# Patient Record
Sex: Male | Born: 1960 | Marital: Married
Health system: Midwestern US, Community
[De-identification: ages and names within clinical notes are randomized; demographics above are authoritative.]

## PROBLEM LIST (undated history)

## (undated) DIAGNOSIS — I10 Essential (primary) hypertension: Secondary | ICD-10-CM

## (undated) DIAGNOSIS — I519 Heart disease, unspecified: Secondary | ICD-10-CM

## (undated) HISTORY — DX: Heart disease, unspecified: I51.9

---

## 2008-07-16 HISTORY — PX: CARDIAC CATHETERIZATION: SHX172

## 2008-07-16 HISTORY — PX: OTHER SURGICAL HISTORY: SHX169

## 2014-12-07 LAB — BASIC METABOLIC PANEL: Glucose: 102

## 2015-02-07 ENCOUNTER — Encounter (HOSPITAL_COMMUNITY): Payer: Self-pay | Admitting: Emergency Medicine

## 2015-02-07 ENCOUNTER — Emergency Department (HOSPITAL_COMMUNITY)
Admission: EM | Admit: 2015-02-07 | Discharge: 2015-02-07 | Disposition: A | Discharge status: Home / Self Care | Payer: Self-pay | Attending: Emergency Medicine | Admitting: Emergency Medicine

## 2015-02-07 ENCOUNTER — Emergency Department (HOSPITAL_COMMUNITY): Payer: Self-pay

## 2015-02-07 DIAGNOSIS — I10 Essential (primary) hypertension: Secondary | ICD-10-CM | POA: Insufficient documentation

## 2015-02-07 DIAGNOSIS — Y9389 Activity, other specified: Secondary | ICD-10-CM | POA: Insufficient documentation

## 2015-02-07 DIAGNOSIS — S8991XA Unspecified injury of right lower leg, initial encounter: Secondary | ICD-10-CM | POA: Insufficient documentation

## 2015-02-07 DIAGNOSIS — X58XXXA Exposure to other specified factors, initial encounter: Secondary | ICD-10-CM | POA: Insufficient documentation

## 2015-02-07 DIAGNOSIS — Y998 Other external cause status: Secondary | ICD-10-CM | POA: Insufficient documentation

## 2015-02-07 DIAGNOSIS — M25561 Pain in right knee: Secondary | ICD-10-CM

## 2015-02-07 DIAGNOSIS — Z9889 Other specified postprocedural states: Secondary | ICD-10-CM | POA: Insufficient documentation

## 2015-02-07 DIAGNOSIS — Z79899 Other long term (current) drug therapy: Secondary | ICD-10-CM | POA: Insufficient documentation

## 2015-02-07 DIAGNOSIS — Y9289 Other specified places as the place of occurrence of the external cause: Secondary | ICD-10-CM | POA: Insufficient documentation

## 2015-02-07 DIAGNOSIS — Z87891 Personal history of nicotine dependence: Secondary | ICD-10-CM | POA: Insufficient documentation

## 2015-02-07 DIAGNOSIS — Z7982 Long term (current) use of aspirin: Secondary | ICD-10-CM | POA: Insufficient documentation

## 2015-02-07 HISTORY — DX: Essential (primary) hypertension: I10

## 2015-02-07 MED ORDER — NAPROXEN 250 MG PO TABS: 250.0000 mg | ORAL_TABLET | Freq: Two times a day (BID) | ORAL | Status: AC

## 2015-02-07 NOTE — ED Notes (Signed)
54 yo with c/o right knee pain and swelling after standing up from a bended position. States he heard a loud pop. Moderate swelling noted with palpable pulse.

## 2015-02-07 NOTE — ED Provider Notes (Signed)
CSN: [id number]     Arrival date & time 02/07/15  1258 History  This chart was scribed for non-physician practitioner, Everlene Farrier, PA-C, working with Linwood Dibbles, MD by Charline Bills, ED Scribe. This patient was seen in room WTR8/WTR8 and the patient's care was started at 2:29 PM.   Chief Complaint  Patient presents with  . Joint Swelling   The history is provided by the patient. No language interpreter was used.   HPI Comments: Manuel Hardy is a 54 y.o. male, with a h/o HTN, who presents to the Emergency Department complaining of gradually worsening, constant right knee pain since yesterday. Pt reports pain after standing from a bent position yesterday. He states that he heard a  loud "pop" in his knee and associated swelling onset yesterday. He is unable to pin point his pain and complains of pain to the medial and lateral aspect as well as anterior.  No previous injury. No treatments tried PTA. No falls. No numbness, tingling or weakness. He denies fevers or chills.  Past Medical History  Diagnosis Date  . Hypertension    Past Surgical History  Procedure Laterality Date  . Cardiac catheterization  2010  . Stents  2010   No family history on file. History  Substance Use Topics  . Smoking status: Former Games developer  . Smokeless tobacco: Not on file  . Alcohol Use: No    Review of Systems  Constitutional: Negative for fever and chills.  Cardiovascular: Negative for leg swelling.  Musculoskeletal: Positive for joint swelling and arthralgias. Negative for gait problem.  Skin: Negative for rash and wound.  Neurological: Negative for weakness and numbness.   Allergies  Review of patient's allergies indicates no known allergies.  Home Medications   Prior to Admission medications   Medication Sig Start Date End Date Taking? Authorizing Provider  aspirin 81 MG tablet Take 81 mg by mouth daily.   Yes Historical Provider, MD  atenolol (TENORMIN) 100 MG tablet Take 100 mg by mouth  daily.   Yes Historical Provider, MD  atorvastatin (LIPITOR) 20 MG tablet Take 20 mg by mouth daily.   Yes Historical Provider, MD  lisinopril (PRINIVIL,ZESTRIL) 20 MG tablet Take 20 mg by mouth daily.   Yes Historical Provider, MD  naproxen (NAPROSYN) 250 MG tablet Take 1 tablet (250 mg total) by mouth 2 (two) times daily with a meal. 02/07/15   Everlene Farrier, PA-C   BP 136/90 mmHg  Pulse 60  Temp(Src) 98.2 F (36.8 C) (Oral)  Resp 18  Ht  (1.549 m)  Wt 250 lb (113.399 kg)  BMI 47.26 kg/m2  SpO2 99% Physical Exam  Constitutional: He appears well-developed and well-nourished. No distress.  Nontoxic appearing.  HENT:  Head: Normocephalic and atraumatic.  Eyes: Right eye exhibits no discharge. Left eye exhibits no discharge.  Cardiovascular: Normal rate and intact distal pulses.   Pulses:      Dorsalis pedis pulses are 2+ on the right side, and 2+ on the left side.       Posterior tibial pulses are 2+ on the right side, and 2+ on the left side.  Pulmonary/Chest: Effort normal. No respiratory distress.  Musculoskeletal:  R knee: Mild edema to anterior aspect of R knee. No knee instability. All lower leg comparments are soft. No evidence of a Baker's cyst. No erythema or warmth. He has good range of motion of his right knee. He is able to ambulate without difficulty or assistance. No calf tenderness or edema.  Neurological: He is alert. Coordination and gait normal.  Able to ambulate without difficulty or assistance. Sensation is intact to his bilateral lower extremities.   Skin: Skin is warm and dry. No rash noted. He is not diaphoretic. No erythema. No pallor.  Psychiatric: He has a normal mood and affect. His behavior is normal.  Nursing note and vitals reviewed.  ED Course  Procedures (including critical care time) DIAGNOSTIC STUDIES: Oxygen Saturation is 99% on RA, normal by my interpretation.    COORDINATION OF CARE: 2:32 PM-Discussed treatment plan which includes XR  with pt at bedside and pt agreed to plan.   Labs Review Labs Reviewed - No data to display  Imaging Review Dg Knee Complete 4 Views Right  02/07/2015   CLINICAL DATA:  Injury yesterday.  Now with knee pain today.  EXAM: RIGHT KNEE - COMPLETE 4+ VIEW  COMPARISON:  None.  FINDINGS: Small joint effusion. Mild tricompartment osteoarthritic change. No fracture. No dislocation.  IMPRESSION: No acute bony pathology.  Joint effusion.   Electronically Signed   By: Jolaine Click M.D.   On: 02/07/2015 15:07    EKG Interpretation None      Filed Vitals:   02/07/15 1359  BP: 136/90  Pulse: 60  Temp: 98.2 F (36.8 C)  TempSrc: Oral  Resp: 18  Height: 5\' 1"  (1.549 m)  Weight: 250 lb (113.399 kg)  SpO2: 99%     MDM   Meds given in ED:  Medications - No data to display  New Prescriptions   NAPROXEN (NAPROSYN) 250 MG TABLET    Take 1 tablet (250 mg total) by mouth 2 (two) times daily with a meal.    Final diagnoses:  Right knee pain   This is a 54 year old male who presents to the emergency department complaining of right knee pain and swelling after he stood up from a bent position and heard a pop yesterday. Is complaining of generalized pain to his knee. He is unable to localize the pain. He denies any fevers, numbness, tingling or weakness. Patient is able to ambulate without difficulty or assistance. On exam he is afebrile and nontoxic appearing. There is no joint warmth. No evidence of septic joint. He has good range of motion of his right knee. I appreciate no knee instability. No knee deformity. He is neurovascularly intact.  X-rays are remarkable only for joint effusion. We'll place the patient in a knee sleeve and have him follow-up with his primary care provider and orthopedic surgery. Patient also given prescription for naproxen. I advised the patient to follow-up with their primary care provider this week. I advised the patient to return to the emergency department with new or  worsening symptoms or new concerns. The patient verbalized understanding and agreement with plan.    I personally performed the services described in this documentation, which was scribed in my presence. The recorded information has been reviewed and is accurate.       Everlene Farrier, PA-C 02/07/15 1536  Linwood Dibbles, MD 02/07/15 [phone]

## 2015-02-07 NOTE — Discharge Instructions (Signed)
Knee Pain °The knee is the complex joint between your thigh and your lower leg. It is made up of bones, tendons, ligaments, and cartilage. The bones that make up the knee are: °· The femur in the thigh. °· The tibia and fibula in the lower leg. °· The patella or kneecap riding in the groove on the lower femur. °CAUSES  °Knee pain is a common complaint with many causes. A few of these causes are: °· Injury, such as: °¨ A ruptured ligament or tendon injury. °¨ Torn cartilage. °· Medical conditions, such as: °¨ Gout °¨ Arthritis °¨ Infections °· Overuse, over training, or overdoing a physical activity. °Knee pain can be minor or severe. Knee pain can accompany debilitating injury. Minor knee problems often respond well to self-care measures or get well on their own. More serious injuries may need medical intervention or even surgery. °SYMPTOMS °The knee is complex. Symptoms of knee problems can vary widely. Some of the problems are: °· Pain with movement and weight bearing. °· Swelling and tenderness. °· Buckling of the knee. °· Inability to straighten or extend your knee. °· Your knee locks and you cannot straighten it. °· Warmth and redness with pain and fever. °· Deformity or dislocation of the kneecap. °DIAGNOSIS  °Determining what is wrong may be very straight forward such as when there is an injury. It can also be challenging because of the complexity of the knee. Tests to make a diagnosis may include: °· Your caregiver taking a history and doing a physical exam. °· Routine X-rays can be used to rule out other problems. X-rays will not reveal a cartilage tear. Some injuries of the knee can be diagnosed by: °· Arthroscopy a surgical technique by which a small video camera is inserted through tiny incisions on the sides of the knee. This procedure is used to examine and repair internal knee joint problems. Tiny instruments can be used during arthroscopy to repair the torn knee cartilage (meniscus). °· Arthrography  is a radiology technique. A contrast liquid is directly injected into the knee joint. Internal structures of the knee joint then become visible on X-ray film. °· An MRI scan is a non X-ray radiology procedure in which magnetic fields and a computer produce two- or three-dimensional images of the inside of the knee. Cartilage tears are often visible using an MRI scanner. MRI scans have largely replaced arthrography in diagnosing cartilage tears of the knee. °· Blood work. °· Examination of the fluid that helps to lubricate the knee joint (synovial fluid). This is done by taking a sample out using a needle and a syringe. °TREATMENT °The treatment of knee problems depends on the cause. Some of these treatments are: °· Depending on the injury, proper casting, splinting, surgery, or physical therapy care will be needed. °· Give yourself adequate recovery time. Do not overuse your joints. If you begin to get sore during workout routines, back off. Slow down or do fewer repetitions. °· For repetitive activities such as cycling or running, maintain your strength and nutrition. °· Alternate muscle groups. For example, if you are a weight lifter, work the upper body on one day and the lower body the next. °· Either tight or weak muscles do not give the proper support for your knee. Tight or weak muscles do not absorb the stress placed on the knee joint. Keep the muscles surrounding the knee strong. °· Take care of mechanical problems. °· If you have flat feet, orthotics or special shoes may help.   See your caregiver if you need help. °· Arch supports, sometimes with wedges on the inner or outer aspect of the heel, can help. These can shift pressure away from the side of the knee most bothered by osteoarthritis. °· A brace called an "unloader" brace also may be used to help ease the pressure on the most arthritic side of the knee. °· If your caregiver has prescribed crutches, braces, wraps or ice, use as directed. The acronym  for this is PRICE. This means protection, rest, ice, compression, and elevation. °· Nonsteroidal anti-inflammatory drugs (NSAIDs), can help relieve pain. But if taken immediately after an injury, they may actually increase swelling. Take NSAIDs with food in your stomach. Stop them if you develop stomach problems. Do not take these if you have a history of ulcers, stomach pain, or bleeding from the bowel. Do not take without your caregiver's approval if you have problems with fluid retention, heart failure, or kidney problems. °· For ongoing knee problems, physical therapy may be helpful. °· Glucosamine and chondroitin are over-the-counter dietary supplements. Both may help relieve the pain of osteoarthritis in the knee. These medicines are different from the usual anti-inflammatory drugs. Glucosamine may decrease the rate of cartilage destruction. °· Injections of a corticosteroid drug into your knee joint may help reduce the symptoms of an arthritis flare-up. They may provide pain relief that lasts a few months. You may have to wait a few months between injections. The injections do have a small increased risk of infection, water retention, and elevated blood sugar levels. °· Hyaluronic acid injected into damaged joints may ease pain and provide lubrication. These injections may work by reducing inflammation. A series of shots may give relief for as long as 6 months. °· Topical painkillers. Applying certain ointments to your skin may help relieve the pain and stiffness of osteoarthritis. Ask your pharmacist for suggestions. Many over the-counter products are approved for temporary relief of arthritis pain. °· In some countries, doctors often prescribe topical NSAIDs for relief of chronic conditions such as arthritis and tendinitis. A review of treatment with NSAID creams found that they worked as well as oral medications but without the serious side effects. °PREVENTION °· Maintain a healthy weight. Extra pounds  put more strain on your joints. °· Get strong, stay limber. Weak muscles are a common cause of knee injuries. Stretching is important. Include flexibility exercises in your workouts. °· Be smart about exercise. If you have osteoarthritis, chronic knee pain or recurring injuries, you may need to change the way you exercise. This does not mean you have to stop being active. If your knees ache after jogging or playing basketball, consider switching to swimming, water aerobics, or other low-impact activities, at least for a few days a week. Sometimes limiting high-impact activities will provide relief. °· Make sure your shoes fit well. Choose footwear that is right for your sport. °· Protect your knees. Use the proper gear for knee-sensitive activities. Use kneepads when playing volleyball or laying carpet. Buckle your seat belt every time you drive. Most shattered kneecaps occur in car accidents. °· Rest when you are tired. °SEEK MEDICAL CARE IF:  °You have knee pain that is continual and does not seem to be getting better.  °SEEK IMMEDIATE MEDICAL CARE IF:  °Your knee joint feels hot to the touch and you have a high fever. °MAKE SURE YOU:  °· Understand these instructions. °· Will watch your condition. °· Will get help right away if you are not   doing well or get worse. °Document Released: 04/29/2007 Document Revised: 09/24/2011 Document Reviewed: 04/29/2007 °[employer]® Patient Information ©2015 [employer]. This information is not intended to replace advice given to you by your health care provider. Make sure you discuss any questions you have with your health care provider. ° °Knee Bracing °Knee braces are supports to help stabilize and protect an injured or painful knee. They come in many different styles. They should support and protect the knee without increasing the chance of other injuries to yourself or others. It is important not to have a false sense of security when using a brace. Knee braces that help you  to keep using your knee: °· Do not restore normal knee stability under high stress forces. °· May decrease some aspects of athletic performance. °Some of the different types of knee braces are: °· Prophylactic knee braces are designed to prevent or reduce the severity of knee injuries during sports that make injury to the knee more likely. °· Rehabilitative knee braces are designed to allow protected motion of: °¨ Injured knees. °¨ Knees that have been treated with or without surgery. °There is no evidence that the use of a supportive knee brace protects the graft following a successful anterior cruciate ligament (ACL) reconstruction. However, braces are sometimes used to:  °· Protect injured ligaments. °· Control knee movement during the initial healing period. °They may be used as part of the treatment program for the various injured ligaments or cartilage of the knee including the: °· Anterior cruciate ligament. °· Medial collateral ligament. °· Medial or lateral cartilage (meniscus). °· Posterior cruciate ligament. °· Lateral collateral ligament. °Rehabilitative knee braces are most commonly used: °· During crutch-assisted walking right after injury. °· During crutch-assisted walking right after surgery to repair the cartilage and/or cruciate ligament injury. °· For a short period of time, 2-8 weeks, after the injury or surgery. °The value of a rehabilitative brace as opposed to a cast or splint includes the: °· Ability to adjust the brace for swelling. °· Ability to remove the brace for examinations, icing, or showering. °· Ability to allow for movement in a controlled range of motion. °Functional knee braces give support to knees that have already been injured. They are designed to provide stability for the injured knee and provide protection after repair. Functional knee braces may not affect performance much. Lower extremity muscle strengthening, flexibility, and improvement in technique are more important  than bracing in treating ligamentous knee injuries. Functional braces are not a substitute for rehabilitation or surgical procedures. °Unloader/off-loader braces are designed to provide pain relief in arthritic knees. Patients with wear and tear arthritis from growing old or from an old cartilage injury (osteoarthritis) of the knee, and bowlegged (varus) or knock-knee (valgus) deformities, often develop increased pain in the arthritic side due to increased loading. Unloader/off-loader braces are made to reduce uneven loading in such knees. There is reduction in bowing out movement in bowlegged knees when the correct unloader brace is used. Patients with advanced osteoarthritis or severe varus or valgus alignment problems would not likely benefit from bracing. °Patellofemoral braces help the kneecap to move smoothly and well centered over the end of the femur in the knee.  °Most people who wear knee braces feel that they help. However, there is a lack of scientific evidence that knee braces are helpful at the level needed for athletic participation to prevent injury. In spite of this, athletes report an increase in knee stability, pain relief, performance improvement, and confidence   during athletics when using a brace.  °Different knee problems require different knee braces: °· Your caregiver may suggest one kind of knee brace after knee surgery. °· A caregiver may choose another kind of knee brace for support instead of surgery for some types of torn ligaments. °· You may also need one for pain in the front of your knee that is not getting better with strengthening and flexibility exercises. °Get your caregiver's advice if you want to try a knee brace. The caregiver will advise you on where to get them and provide a prescription when it is needed to fashion and/or fit the brace. °Knee braces are the least important part of preventing knee injuries or getting better following injury. Stretching, strengthening and  technique improvement are far more important in caring for and preventing knee injuries. When strengthening your knee, increase your activities a little at a time so as not to develop injuries from overuse. Work out an exercise plan with your caregiver and/or physical therapist to get the best program for you. Do not let a knee brace become a crutch. °Always remember, there are no braces which support the knee as well as your original ligaments and cartilage you were born with. Conditioning, proper warm-up, and stretching remain the most important parts of keeping your knees healthy. °HOW TO USE A KNEE BRACE °· During sports, knee braces should be used as directed by your caregiver. °· Make sure that the hinges are where the knee bends. °· Straps, tapes, or hook-and-loop tapes should be fastened around your leg as instructed. °· You should check the placement of the brace during activities to make sure that it has not moved. Poorly positioned braces can hurt rather than help you. °· To work well, a knee brace should be worn during all activities that put you at risk of knee injury. °· Warm up properly before beginning athletic activities. °HOME CARE INSTRUCTIONS °· Knee braces often get damaged during normal use. Replace worn-out braces for maximum benefit. °· Clean regularly with soap and water. °· Inspect your brace often for wear and tear. °· Cover exposed metal to protect others from injury. °· Durable materials may cost more, but last longer. °SEEK IMMEDIATE MEDICAL CARE IF:  °· Your knee seems to be getting worse rather than better. °· You have increasing pain or swelling in the knee. °· You have problems caused by the knee brace. °· You have increased swelling or inflammation (redness or soreness) in your knee. °· Your knee becomes warm and more painful and you develop an unexplained temperature over 101°F (38.3°C). °MAKE SURE YOU:  °· Understand these instructions. °· Will watch your condition. °· Will get  help right away if you are not doing well or get worse. °See your caregiver, physical therapist, or orthopedic surgeon for additional information. °Document Released: 09/22/2003 Document Revised: 11/16/2013 Document Reviewed: 12/29/2008 °Exit[employer]® Patient Information ©2015 [employer]. This information is not intended to replace advice given to you by your health care provider. Make sure you discuss any questions you have with your health care provider. ° °

## 2015-03-05 DIAGNOSIS — Z0130 Encounter for examination of blood pressure without abnormal findings: Secondary | ICD-10-CM | POA: Insufficient documentation

## 2016-05-18 IMAGING — CR DG KNEE COMPLETE 4+V*R*
4 series · 4 of 4 positions shown · non-contrast
Comparison: None.

CLINICAL DATA: Injury yesterday.  Now with knee pain today.

EXAM:
RIGHT KNEE - COMPLETE 4+ VIEW

[t knee ap right]
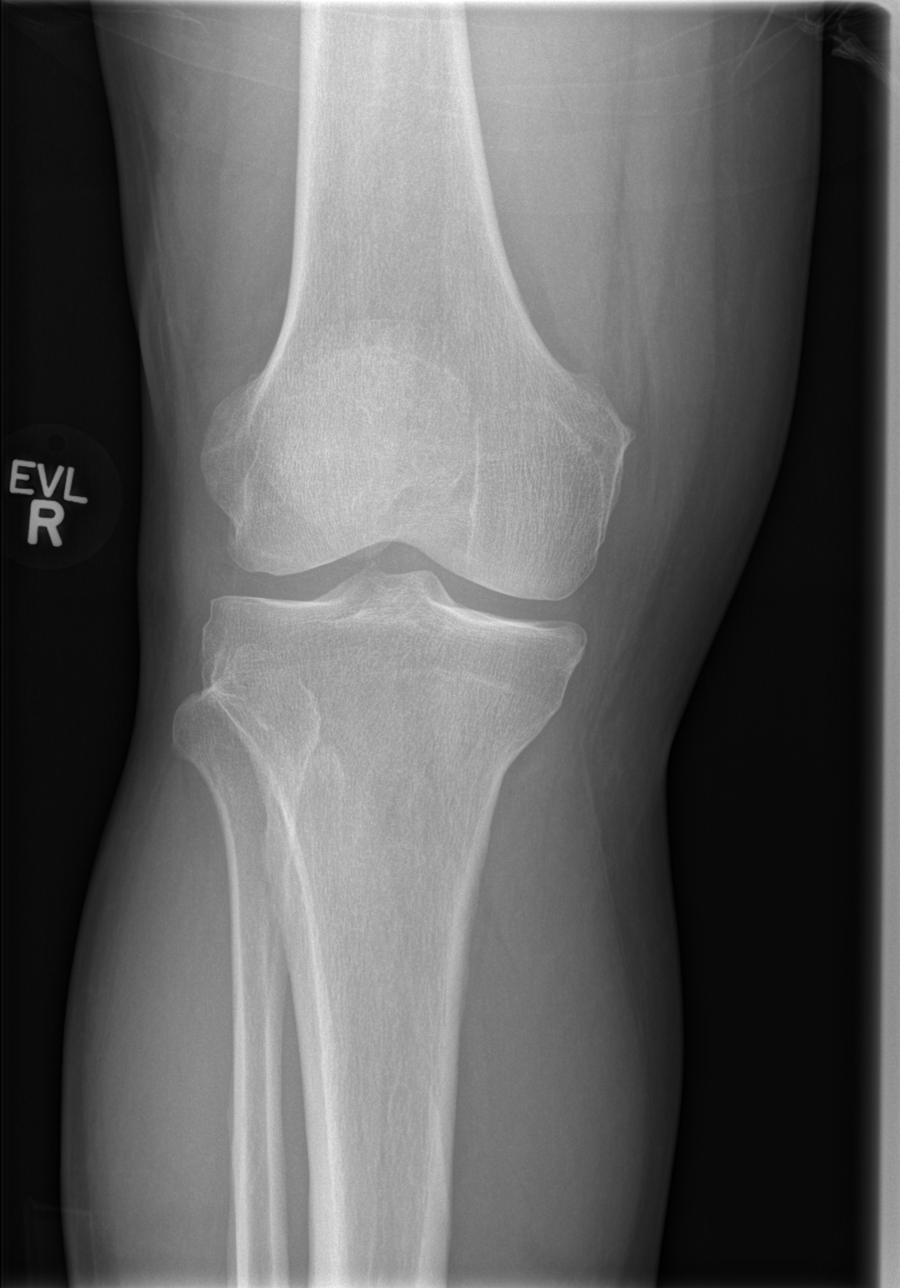

[t knee obl right (1 of 2)]
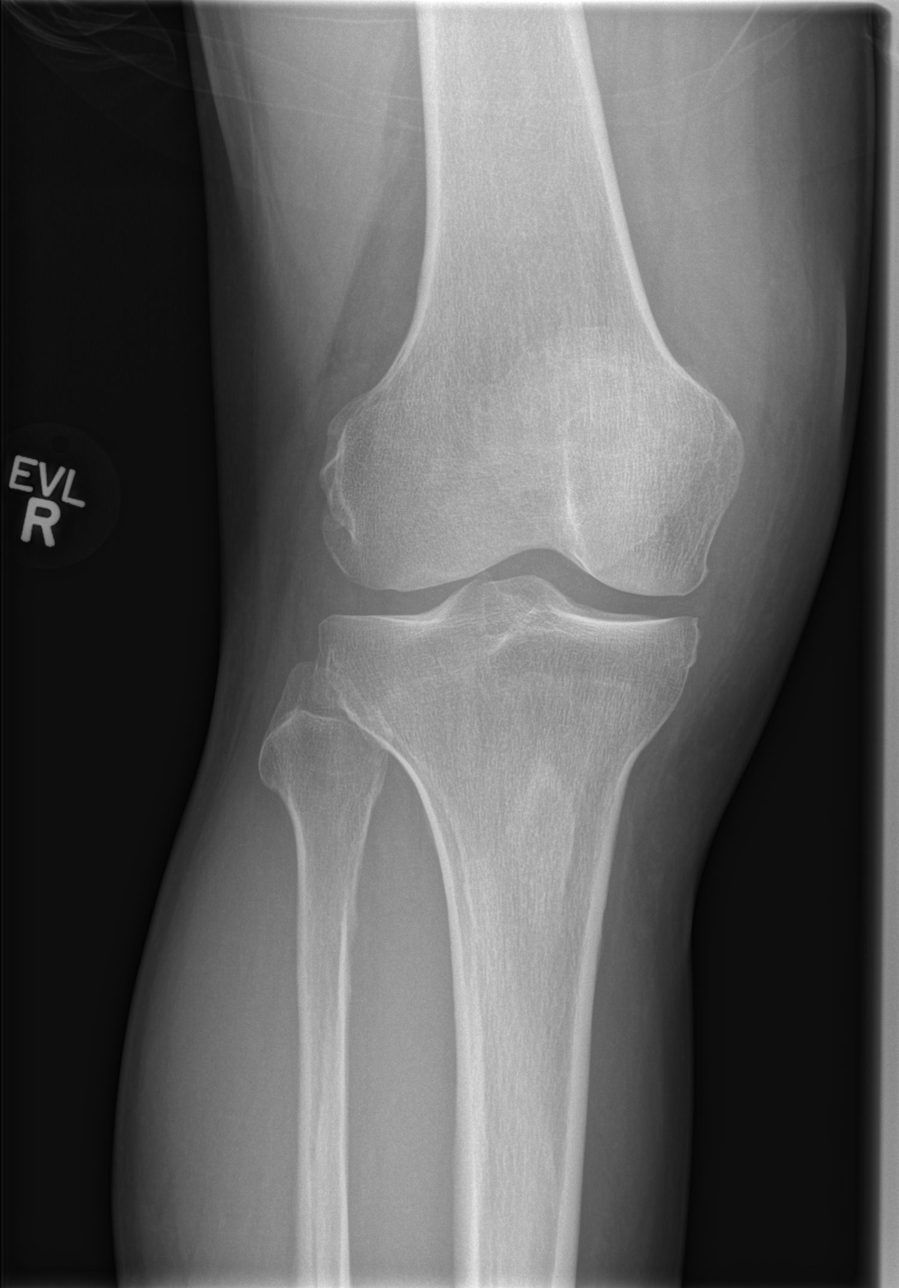

[t knee obl right (2 of 2)]
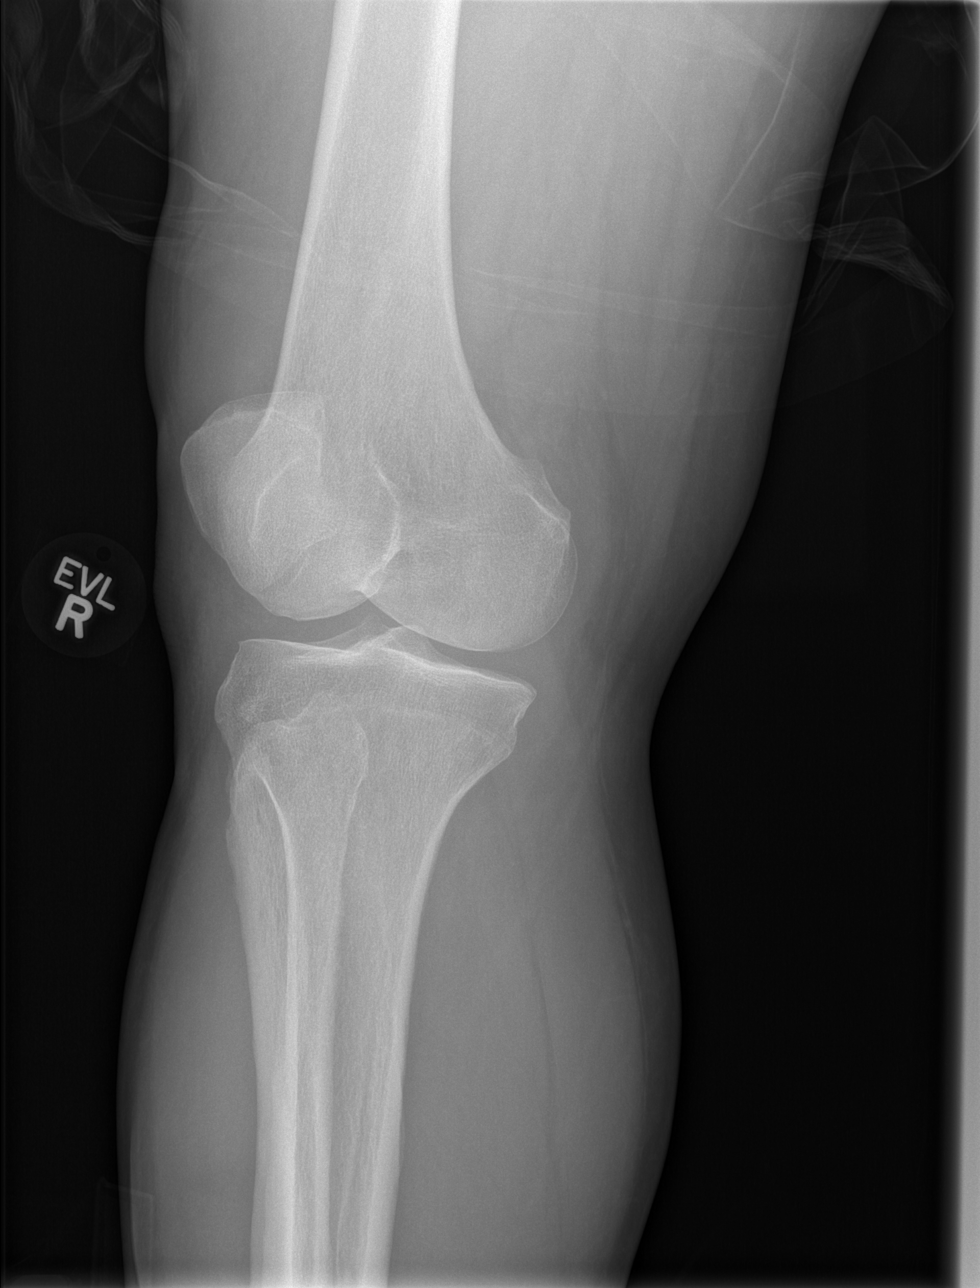

[t knee lat right]
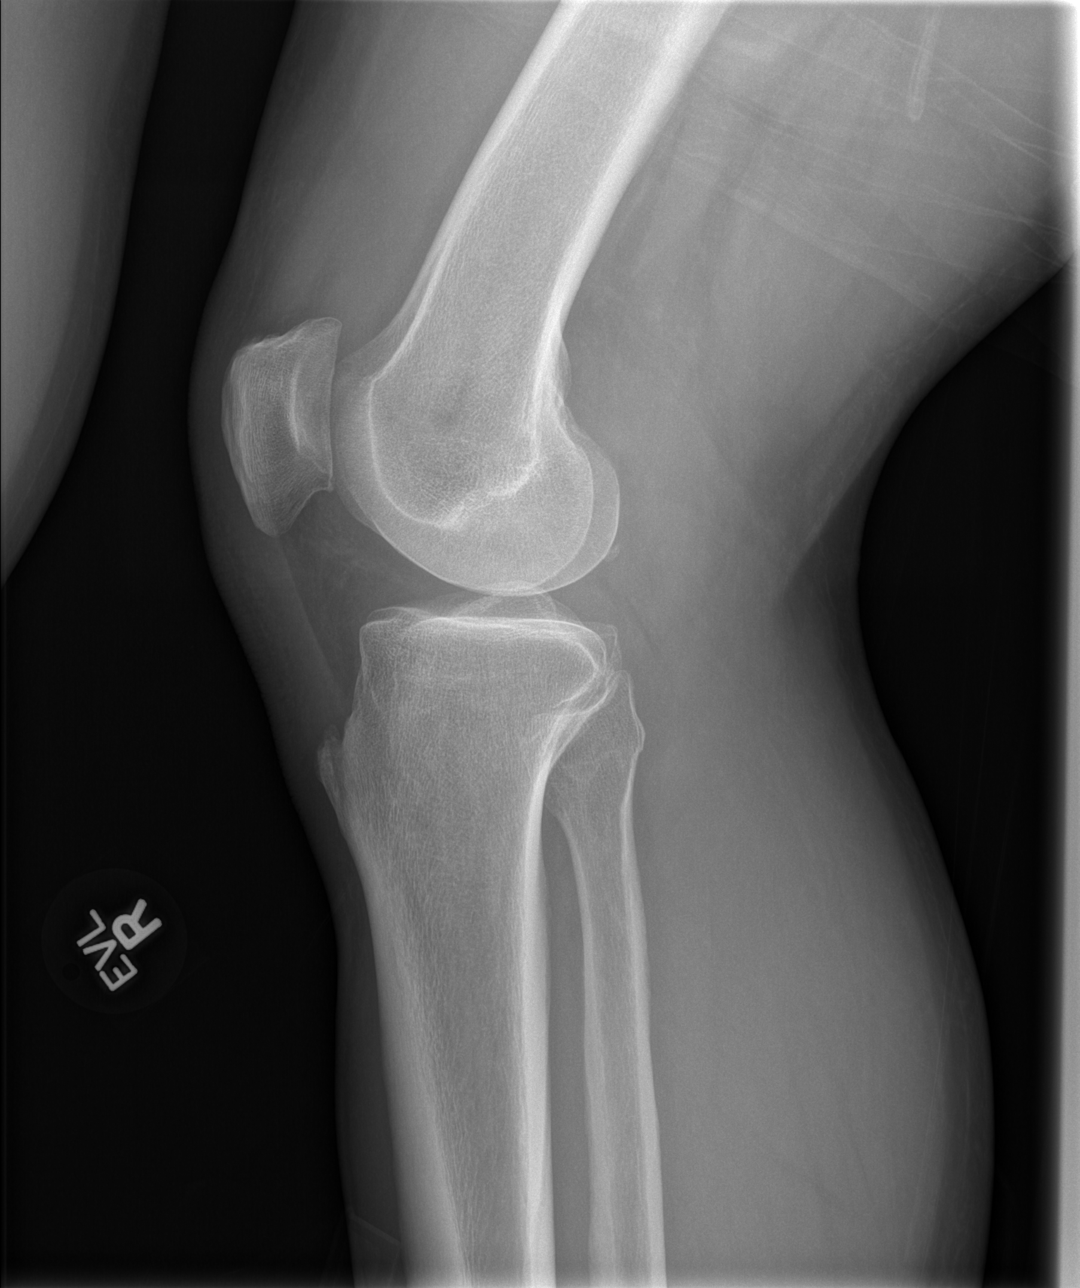

[4 of 4 positions shown; findings below may reference images not displayed]

FINDINGS: Small joint effusion. Mild tricompartment osteoarthritic change. No
fracture. No dislocation.
IMPRESSION: No acute bony pathology.  Joint effusion.

## 2016-12-26 ENCOUNTER — Ambulatory Visit
Admit: 2016-12-26 | Discharge: 2016-12-26 | Payer: MEDICAID | Attending: Foot and Ankle Surgery | Primary: Student in an Organized Health Care Education/Training Program

## 2016-12-26 DIAGNOSIS — L089 Local infection of the skin and subcutaneous tissue, unspecified: Secondary | ICD-10-CM

## 2016-12-26 NOTE — Progress Notes (Signed)
New Patient Visit    Date:  12/26/16    Name: James Landry  MRN: [id number]      CC:   Chief Complaint   Patient presents with   ??? Foot Pain     left foot infection between 4-5 toe,         HPI:  James Landry is a 56 y.o. male who presents with Left    Chief Complaint   Patient presents with   ??? Foot Pain     left foot infection between 4-5 toe,      He has had 10 days of left foot pain swelling and acute increasing of erythema between the 4th and 5th toes.  He was seen at [facility] emergency room yesterday for cellulitis that developed near this infection.  He was treated with intravenous antibiotics and given a 10 day dose of oral antibiotics.  Overnight the erythema has improved dramatically, the swelling has gone down, and the pain has improved.  He denies any fever or chills in no other ft pain. He is disabled due to a history of coronary artery disease.      PMH:   Past Medical History:   Diagnosis Date   ??? Back pain    ??? Depression    ??? Hypertension        PSH:   Past Surgical History:   Procedure Laterality Date   ??? HX CORONARY STENT PLACEMENT  2010       Social History:   Social History     Social History   ??? Marital status: UNKNOWN     Spouse name: N/A   ??? Number of children: N/A   ??? Years of education: N/A     Occupational History   ??? Not on file.     Social History Main Topics   ??? Smoking status: Former Smoker   ??? Smokeless tobacco: Not on file   ??? Alcohol use No   ??? Drug use: Not on file   ??? Sexual activity: Not on file     Other Topics Concern   ??? Not on file     Social History Narrative   ??? No narrative on file       Allergies: Allergies no known allergies    Medications:   Prior to Admission medications    Medication Sig Start Date End Date Taking? Authorizing Provider   levoFLOXacin (LEVAQUIN) 500 mg tablet Take  by mouth daily.   Yes Historical Provider   trimethoprim-sulfamethoxazole (BACTRIM DS) 160-800 mg per tablet Take 1  Tab by mouth two (2) times a day.   Yes Historical Provider   aspirin delayed-release 81 mg tablet Take  by mouth daily.   Yes Historical Provider   atorvastatin (LIPITOR) 80 mg tablet Take 80 mg by mouth daily.   Yes Historical Provider   lisinopril-hydroCHLOROthiazide (PRINZIDE, ZESTORETIC) 20-25 mg per tablet Take  by mouth daily.   Yes Historical Provider   metoprolol tartrate (LOPRESSOR) 25 mg tablet Take  by mouth two (2) times a day.   Yes Historical Provider   nitroglycerin (NITROSTAT) 0.4 mg SL tablet 0.4 mg by SubLINGual route every five (5) minutes as needed for Chest Pain. Up to 3 doses.   Yes Historical Provider       Problem List:   Patient Active Problem List   Diagnosis Code   ??? Hypertension I10   ??? Back pain M54.9   ??? Depression F32.9   ???  Severe obesity (BMI 35.0-39.9) (HCC) E66.01   ??? Left foot infection L08.9   ??? Cellulitis of foot, left L03.116         ROS  General: no fevers, chills, night sweats  Psychiatric: no suicidal ideation, anxiety  Neurologic: no headaches, loss of consciousness or change in vision  Cardiovascular: no chest pain  Respiratory: no shortness of breath  GI: no abdominal pain, no ulcers  Skin: no infections, no rashes  Hematologic: no bleeding disorders, history of blood clots  Endocrine: No diabetes mellitus  Musckuloskeletal:   Chief Complaint   Patient presents with   ??? Foot Pain     left foot infection between 4-5 toe,         PHYSICAL EXAM:  Visit Vitals   ??? Ht 5\' 6"  (1.676 m)   ??? Wt 217 lb (98.4 kg)   ??? BMI 35.02 kg/m2     @BW @        Body mass index is 35.02 kg/(m^2).    General:  The patient is a well-appearing 56 y.o. male who appears his stated age. he is alert and oriented x3, pleasant, conversive, interactive and appropriate.        Gait Abnormalities: none    Inspection:   Left foot neutral  Left Lower Extremity Exam:    Skin:  There is a small open wound in the 4th webspace between the 4th and 5th  toes there is no active drainage or purulent material no surrounding cellulitis.  Swelling: mild at the forefoot  Sensory: 2/2 to light touch in the deep peroneal, superficial peroneal, sural, saphenous, tibial    Vascular:  normal DP and PT pulses    Motor: 5/5 strength to dorsiflexion, plantarflexion, inversion, eversion and great toe flexion and extension.    Palpation:   Mildly tender over the 4th webspace    Ligament exam: Negative        IMAGING:  None      IMPRESSION:  Encounter Diagnoses     ICD-10-CM ICD-9-CM   1. Left foot infection L08.9 686.9   2. Cellulitis of foot, left L03.116 682.7        PLAN:  Today we discussed the diagnosis and treatment options. The oral antibiotics have improved the cellulitis and purulence therefore he will continue packing once daily with the help of his wife.  He will finish the 10 day course of oral antibiotics and will follow up with me in 2 weeks he knows to call if he develops worsening of the cellulitis.  Once or any fever or chills.      FOLLOW-UP: in 2 week(s)    IMAGING NEXT VISIT: no

## 2017-01-09 ENCOUNTER — Ambulatory Visit
Admit: 2017-01-09 | Discharge: 2017-01-09 | Payer: MEDICAID | Attending: Foot and Ankle Surgery | Primary: Student in an Organized Health Care Education/Training Program

## 2017-01-09 DIAGNOSIS — S91302D Unspecified open wound, left foot, subsequent encounter: Secondary | ICD-10-CM

## 2017-01-09 NOTE — Progress Notes (Signed)
New Patient Visit    Date:  01/09/17    Name: James Landry  MRN: [id number]      CC:   Chief Complaint   Patient presents with   ??? Foot Pain     left foot  infection followup         HPI:  Hamish Regie Bunner is a 56 y.o. male who presents with Left    Chief Complaint   Patient presents with   ??? Foot Pain     left foot  infection followup      He has been off his antibiotics for 4 days now and denies any erythema the infection has cleared up.  He is cleaning the wound daily with Betadine on but not placing any dressings are toe spacer.  He denies any problems and no pain.      PMH:   Past Medical History:   Diagnosis Date   ??? Back pain    ??? Depression    ??? Hypertension        PSH:   Past Surgical History:   Procedure Laterality Date   ??? HX CORONARY STENT PLACEMENT  2010       Social History:   Social History     Social History   ??? Marital status: UNKNOWN     Spouse name: N/A   ??? Number of children: N/A   ??? Years of education: N/A     Occupational History   ??? Not on file.     Social History Main Topics   ??? Smoking status: Former Smoker   ??? Smokeless tobacco: Not on file   ??? Alcohol use No   ??? Drug use: Not on file   ??? Sexual activity: Not on file     Other Topics Concern   ??? Not on file     Social History Narrative   ??? No narrative on file       Allergies: No Known Allergies    Medications:   Prior to Admission medications    Medication Sig Start Date End Date Taking? Authorizing Provider   aspirin delayed-release 81 mg tablet Take  by mouth daily.   Yes Historical Provider   atorvastatin (LIPITOR) 80 mg tablet Take 80 mg by mouth daily.   Yes Historical Provider   lisinopril-hydroCHLOROthiazide (PRINZIDE, ZESTORETIC) 20-25 mg per tablet Take  by mouth daily.   Yes Historical Provider   metoprolol tartrate (LOPRESSOR) 25 mg tablet Take  by mouth two (2) times a day.   Yes Historical Provider   nitroglycerin (NITROSTAT) 0.4 mg SL tablet 0.4 mg by SubLINGual route  every five (5) minutes as needed for Chest Pain. Up to 3 doses.   Yes Historical Provider   levoFLOXacin (LEVAQUIN) 500 mg tablet Take  by mouth daily.    Historical Provider   trimethoprim-sulfamethoxazole (BACTRIM DS) 160-800 mg per tablet Take 1 Tab by mouth two (2) times a day.    Historical Provider       Problem List:   Patient Active Problem List   Diagnosis Code   ??? Hypertension I10   ??? Back pain M54.9   ??? Depression F32.9   ??? Severe obesity (BMI 35.0-39.9) (HCC) E66.01   ??? Left foot infection L08.9   ??? Cellulitis of foot, left L03.116         ROS  General: no fevers, chills, night sweats  Psychiatric: no suicidal ideation, anxiety  Neurologic: no headaches, loss of consciousness  or change in vision  Cardiovascular: no chest pain  Respiratory: no shortness of breath  GI: no abdominal pain, no ulcers  Skin: no infections, no rashes  Hematologic: no bleeding disorders, history of blood clots  Endocrine: No diabetes mellitus  Musckuloskeletal:   Chief Complaint   Patient presents with   ??? Foot Pain     left foot  infection followup         PHYSICAL EXAM:  There were no vitals taken for this visit.  @BW @        There is no height or weight on file to calculate BMI.    General:  The patient is a well-appearing 56 y.o. male who appears his stated age. he is alert and oriented x3, pleasant, conversive, interactive and appropriate.        Gait Abnormalities: none    Inspection:   Left foot neutral  Left Lower Extremity Exam:    Skin:  There is a small open wound in the 4th webspace between the 4th and 5th toes there is no active drainage or purulent material no surrounding cellulitis.  The wound is macerated and there is some thickened macerated tissue with healing deep to this with granulation tissue  Swelling:   None  Sensory: 2/2 to light touch in the deep peroneal, superficial peroneal, sural, saphenous, tibial    Vascular:  normal DP and PT pulses     Motor: 5/5 strength to dorsiflexion, plantarflexion, inversion, eversion and great toe flexion and extension.    Palpation:   Mildly tender over the 4th webspace    Ligament exam: Negative        IMAGING:  None      IMPRESSION:  Encounter Diagnoses     ICD-10-CM ICD-9-CM   1. Wound, open, foot, left, subsequent encounter S91.302D V58.89     892.0        PLAN:  His infection has cleared up however the wound is still present and macerated today.  He has not been placing any dressings in that webspace and it is a moist area that will delay healing if not properly addressed. I placed a wound care consult into [facility] wound care and expect foot some minor debridement with soft tissue dressings toe cyst and drying the wound will allow him to his assist in his recovery quicker.  He will follow up with me in 6 weeks and this wound should be healed at that time if he has any increased redness or drainage I should the hearing from him and he was told to call and come in sooner if that occurs.

## 2017-02-20 ENCOUNTER — Ambulatory Visit
Admit: 2017-02-20 | Discharge: 2017-02-20 | Payer: MEDICAID | Attending: Foot and Ankle Surgery | Primary: Student in an Organized Health Care Education/Training Program

## 2017-02-20 DIAGNOSIS — S91302D Unspecified open wound, left foot, subsequent encounter: Secondary | ICD-10-CM

## 2017-02-20 NOTE — Progress Notes (Signed)
New Patient Visit    Date:  02/20/17    Name: James Landry  MRN: [id number]      CC:   Chief Complaint   Patient presents with   ??? Foot Pain     followup toe infection         HPI:  James Landry is a 56 y.o. male who presents with Left    Chief Complaint   Patient presents with   ??? Foot Pain     followup toe infection      He is here for follow-up of his left 4th webspace wound. He did go to wound care and despite this he still healed his wound. He denies any further redness and the webspace has healed completely.  The low left foot has no problems today and the right he does report pain at the plantar aspect of his heel.  He reports he had a shot in the past which did not improve the pain. He has not been doing any stretching.      PMH:   Past Medical History:   Diagnosis Date   ??? Back pain    ??? Depression    ??? Hypertension        PSH:   Past Surgical History:   Procedure Laterality Date   ??? HX CORONARY STENT PLACEMENT  2010       Social History:   Social History     Social History   ??? Marital status: MARRIED     Spouse name: N/A   ??? Number of children: N/A   ??? Years of education: N/A     Occupational History   ??? Not on file.     Social History Main Topics   ??? Smoking status: Former Smoker   ??? Smokeless tobacco: Not on file   ??? Alcohol use No   ??? Drug use: Not on file   ??? Sexual activity: Not on file     Other Topics Concern   ??? Not on file     Social History Narrative   ??? No narrative on file       Allergies: No Known Allergies    Medications:   Prior to Admission medications    Medication Sig Start Date End Date Taking? Authorizing Provider   aspirin delayed-release 81 mg tablet Take  by mouth daily.   Yes Historical Provider   atorvastatin (LIPITOR) 80 mg tablet Take 80 mg by mouth daily.   Yes Historical Provider   lisinopril-hydroCHLOROthiazide (PRINZIDE, ZESTORETIC) 20-25 mg per tablet Take  by mouth daily.   Yes Historical Provider    metoprolol tartrate (LOPRESSOR) 25 mg tablet Take  by mouth two (2) times a day.   Yes Historical Provider   nitroglycerin (NITROSTAT) 0.4 mg SL tablet 0.4 mg by SubLINGual route every five (5) minutes as needed for Chest Pain. Up to 3 doses.   Yes Historical Provider   levoFLOXacin (LEVAQUIN) 500 mg tablet Take  by mouth daily.    Historical Provider   trimethoprim-sulfamethoxazole (BACTRIM DS) 160-800 mg per tablet Take 1 Tab by mouth two (2) times a day.    Historical Provider       Problem List:   Patient Active Problem List   Diagnosis Code   ??? Hypertension I10   ??? Back pain M54.9   ??? Depression F32.9   ??? Severe obesity (BMI 35.0-39.9) (HCC) E66.01   ??? Left foot infection L08.9   ??? Wound,  open, foot, left, subsequent encounter S91.302D   ??? Plantar fasciitis, right M72.2         ROS  General: no fevers, chills, night sweats  Psychiatric: no suicidal ideation, positive for anxiety and depression  Neurologic: no headaches, loss of consciousness or change in vision  Cardiovascular: no chest pain  Respiratory: no shortness of breath  GI: no abdominal pain, no ulcers  Skin: no infections, no rashes  Hematologic: no bleeding disorders, history of blood clots  Endocrine: No diabetes mellitus  Musckuloskeletal:   Negative for difficulty walking, joint pain, joint swelling, muscle weakness        PHYSICAL EXAM:  There were no vitals taken for this visit.  @BW @        There is no height or weight on file to calculate BMI.    General:  The patient is a well-appearing 56 y.o. male who appears his stated age. he is alert and oriented x3, pleasant, conversive, interactive and appropriate.        Gait Abnormalities: none    Inspection:   Left foot neutral  Left Lower Extremity Exam:    Skin:  The 4th webspace has completely healed on the left there is no erythema or any skin abnormalities  Swelling:   None  Sensory: 2/2 to light touch in the deep peroneal, superficial peroneal, sural, saphenous, tibial     Vascular:  normal DP and PT pulses    Motor: 5/5 strength to dorsiflexion, plantarflexion, inversion, eversion and great toe flexion and extension.    Palpation:   Tenderness to palpation on the right foot at the insertion of the plantar fascia    Ligament exam: Negative        IMAGING:  None      IMPRESSION:  Encounter Diagnoses     ICD-10-CM ICD-9-CM   1. Wound, open, foot, left, subsequent encounter S91.302D V58.89     892.0   2. Plantar fasciitis, right M72.2 728.71        PLAN:  His infection has cleared up in the wound is completely healed on the left.  He has mild plantar fasciitis on the right and I taught him gastrocnemius stretching today. He will start doing the stretching daily and do on both sides.  He will continue to wear a small cotton gauze in his 4th webspace to prevent maceration especially during the hot summer months.  He will follow up with me as needed.

## 2019-01-14 DIAGNOSIS — Z0130 Encounter for examination of blood pressure without abnormal findings: Secondary | ICD-10-CM
